# Patient Record
Sex: Male | Born: 1995 | Race: White | Hispanic: No | Marital: Single
Health system: Southern US, Community
[De-identification: ages and names within clinical notes are randomized; demographics above are authoritative.]

## PROBLEM LIST (undated history)

## (undated) HISTORY — PX: SHOULDER SURGERY: SHX246

---
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
## 2017-02-14 DIAGNOSIS — F900 Attention-deficit hyperactivity disorder, predominantly inattentive type: Secondary | ICD-10-CM | POA: Diagnosis not present

## 2017-10-03 DIAGNOSIS — Z23 Encounter for immunization: Secondary | ICD-10-CM | POA: Diagnosis not present

## 2019-07-14 DIAGNOSIS — Z20828 Contact with and (suspected) exposure to other viral communicable diseases: Secondary | ICD-10-CM | POA: Diagnosis not present

## 2019-08-15 DIAGNOSIS — Z20828 Contact with and (suspected) exposure to other viral communicable diseases: Secondary | ICD-10-CM | POA: Diagnosis not present

## 2021-07-16 DIAGNOSIS — Z23 Encounter for immunization: Secondary | ICD-10-CM | POA: Diagnosis not present

## 2021-10-28 ENCOUNTER — Other Ambulatory Visit: Payer: Self-pay

## 2021-10-28 ENCOUNTER — Encounter: Payer: Self-pay | Admitting: Emergency Medicine

## 2021-10-28 ENCOUNTER — Ambulatory Visit
Admission: EM | Admit: 2021-10-28 | Discharge: 2021-10-28 | Disposition: A | Discharge status: Home / Self Care | Payer: BC Managed Care – PPO | Attending: Physician Assistant | Admitting: Physician Assistant

## 2021-10-28 ENCOUNTER — Ambulatory Visit (INDEPENDENT_AMBULATORY_CARE_PROVIDER_SITE_OTHER): Payer: BC Managed Care – PPO

## 2021-10-28 DIAGNOSIS — S61210A Laceration without foreign body of right index finger without damage to nail, initial encounter: Secondary | ICD-10-CM

## 2021-10-28 MED ORDER — TETANUS-DIPHTH-ACELL PERTUSSIS 5-2.5-18.5 LF-MCG/0.5 IM SUSY
0.5000 mL | PREFILLED_SYRINGE | Freq: Once | INTRAMUSCULAR | Status: AC
  Administered 2021-10-28: 0.5 mL via INTRAMUSCULAR

## 2021-10-28 NOTE — ED Provider Notes (Signed)
[facility]    CSN: [id number] Arrival date & time: 10/28/21  1035      History   Chief Complaint Chief Complaint  Patient presents with   Finger Injury    HPI Joshua Stein is a 26 y.o. male.   Patient here today for evaluation of laceration to right index finger that occurred last night while him and his brother were working on a truck. He reports he accidentally had drill bit go through part of his distal index finger. He had immediate pain and bleeding. They were able to control bleeding with compressive dressing which he wore throughout the night. Wound was cleaned several times. He is due for tetanus vaccine. Father is a physician and is here with patient today.   The history is provided by the patient.   History reviewed. No pertinent past medical history.  There are no problems to display for this patient.   History reviewed. No pertinent surgical history.     Home Medications    Prior to Admission medications   Not on File    Family History History reviewed. No pertinent family history.  Social History     Allergies   Patient has no known allergies.   Review of Systems Review of Systems  Constitutional:  Negative for chills and fever.  Eyes:  Negative for discharge and redness.  Skin:  Positive for wound. Negative for color change.  Neurological:  Negative for numbness.    Physical Exam Triage Vital Signs ED Triage Vitals  Enc Vitals Group     BP      Pulse      Resp      Temp      Temp src      SpO2      Weight      Height      Head Circumference      Peak Flow      Pain Score      Pain Loc      Pain Edu?      Excl. in GC?    No data found.  Updated Vital Signs BP 136/90 (BP Location: Left Arm)    Pulse 90    Temp 98.1 F (36.7 C) (Oral)    Resp 18    SpO2 99%      Physical Exam Vitals and nursing note reviewed.  Constitutional:      General: He is not in acute distress.    Appearance: Normal appearance. He  is not ill-appearing.  HENT:     Head: Normocephalic and atraumatic.  Eyes:     Conjunctiva/sclera: Conjunctivae normal.  Cardiovascular:     Rate and Rhythm: Normal rate.  Pulmonary:     Effort: Pulmonary effort is normal.  Musculoskeletal:     Comments: Mildly decreased ROM of right index PIP due to pain  Skin:    Capillary Refill: Normal cap refill to distal right index finger    Comments: ~ 2.5 cm irregularly shaped laceration to right lateral distal index finger starting at PIP and extending to distal finger pad, minimal active bleeding  Neurological:     Mental Status: He is alert.     Comments: Sensation intact to distal right finger  Psychiatric:        Mood and Affect: Mood normal.        Behavior: Behavior normal.        Thought Content: Thought content normal.     UC Treatments /  Results  Labs (all labs ordered are listed, but only abnormal results are displayed) Labs Reviewed - No data to display  EKG   Radiology DG Finger Index Right  Result Date: 10/28/2021 CLINICAL DATA:  Patient with laceration to the index finger. EXAM: RIGHT INDEX FINGER 2+V COMPARISON:  None. FINDINGS: Soft tissue laceration to the distal aspect of the index finger. No radiopaque foreign body. No evidence for acute osseous abnormality. IMPRESSION: Soft tissue injury to the distal index finger. No definite acute osseous abnormality. Electronically Signed   By: Annia Belt M.D.   On: 10/28/2021 11:28    Procedures Laceration Repair  Date/Time: 10/28/2021 3:26 PM Performed by: Tomi Bamberger, PA-C Authorized by: Tomi Bamberger, PA-C   Consent:    Consent obtained:  Verbal   Consent given by:  Patient   Risks, benefits, and alternatives were discussed: yes     Risks discussed:  Infection and pain   Alternatives discussed:  No treatment Universal protocol:    Procedure explained and questions answered to patient or proxy's satisfaction: yes     Imaging studies available: yes      Required blood products, implants, devices, and special equipment available: yes     Patient identity confirmed:  Provided demographic data Anesthesia:    Anesthesia method:  Local infiltration   Local anesthetic:  Lidocaine 1% w/o epi (2 cc) Laceration details:    Location:  Finger   Finger location:  R index finger   Length (cm):  2.5   Depth (mm):  3 Exploration:    Contaminated: no   Treatment:    Area cleansed with:  Povidone-iodine   Amount of cleaning:  Standard   Debridement:  None Skin repair:    Repair method:  Sutures   Suture size:  4-0   Suture material:  Nylon   Suture technique:  Simple interrupted   Number of sutures:  7 Approximation:    Approximation:  Close Repair type:    Repair type:  Simple Post-procedure details:    Dressing:  Open (no dressing)   Procedure completion:  Tolerated well, no immediate complications (including critical care time)  Medications Ordered in UC Medications  Tdap (BOOSTRIX) injection 0.5 mL (0.5 mLs Intramuscular Given 10/28/21 1221)    Initial Impression / Assessment and Plan / UC Course  I have reviewed the triage vital signs and the nursing notes.  Pertinent labs & imaging results that were available during my care of the patient were reviewed by me and considered in my medical decision making (see chart for details).    Xray without fracture or foreign body. Laceration repaired in office with precaution to monitor for signs of infection. Advised suture removal in 7-10 days. Encourage sooner follow up with any concerns.   Final Clinical Impressions(s) / UC Diagnoses   Final diagnoses:  Laceration of right index finger without foreign body without damage to nail, initial encounter     Discharge Instructions       Keep wound clean and dry. Follow up with any signs of infection including increased erythema, swelling, pain, drainage. I would recommend removing sutures in 7-10 days.      ED Prescriptions   None     PDMP not reviewed this encounter.   Tomi Bamberger, PA-C 10/28/21 1529

## 2021-10-28 NOTE — Discharge Instructions (Signed)
°  Keep wound clean and dry. Follow up with any signs of infection including increased erythema, swelling, pain, drainage. I would recommend removing sutures in 7-10 days.

## 2021-10-28 NOTE — ED Triage Notes (Signed)
Pt here for right index finger laceration from drill bit last night; bleeding controlled

## 2021-11-19 DIAGNOSIS — M79671 Pain in right foot: Secondary | ICD-10-CM | POA: Diagnosis not present

## 2021-11-19 DIAGNOSIS — S9031XA Contusion of right foot, initial encounter: Secondary | ICD-10-CM | POA: Diagnosis not present

## 2022-09-11 IMAGING — DX DG FINGER INDEX 2+V*R*
2 series · 2 of 2 positions shown · non-contrast
Comparison: None.

CLINICAL DATA: Patient with laceration to the index finger.

EXAM:
RIGHT INDEX FINGER 2+V

[finger pa]
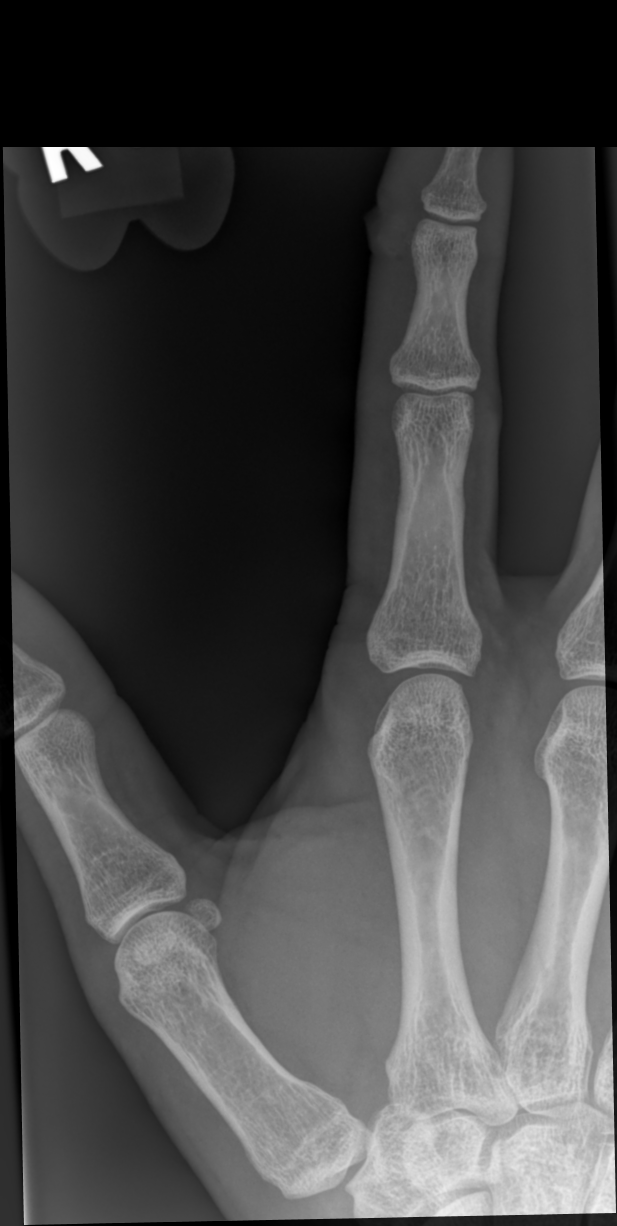

[finger lat]
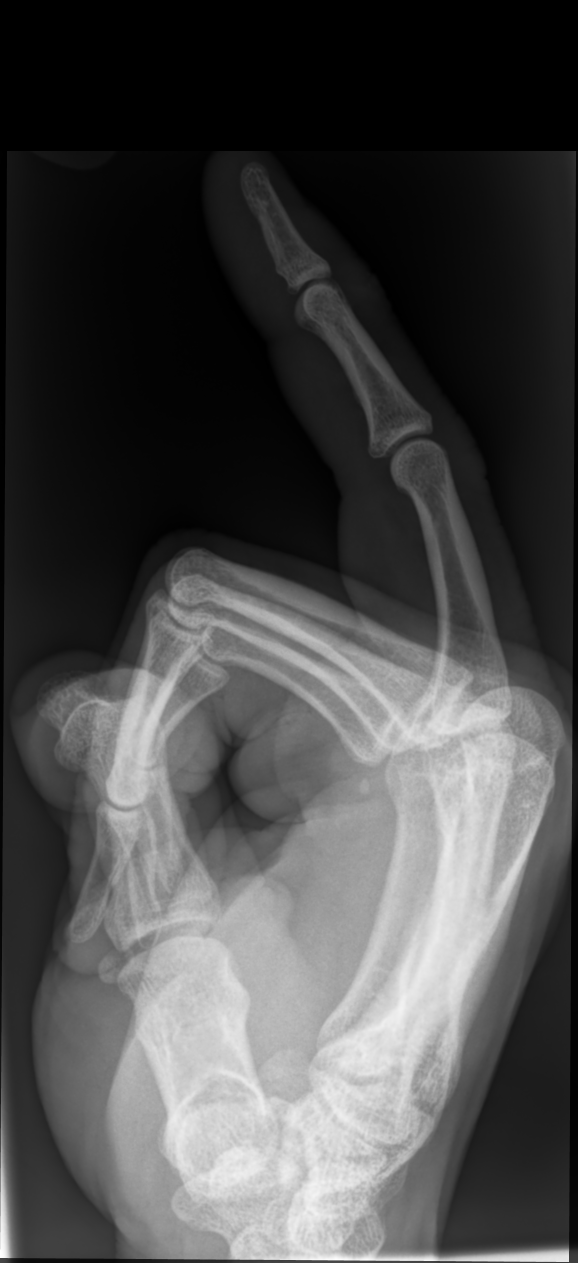

[2 of 2 positions shown; findings below may reference images not displayed]

FINDINGS: Soft tissue laceration to the distal aspect of the index finger. No
radiopaque foreign body. No evidence for acute osseous abnormality.
IMPRESSION: Soft tissue injury to the distal index finger. No definite acute
osseous abnormality.

## 2022-10-08 DIAGNOSIS — S6992XA Unspecified injury of left wrist, hand and finger(s), initial encounter: Secondary | ICD-10-CM | POA: Diagnosis not present

## 2023-01-09 DIAGNOSIS — M542 Cervicalgia: Secondary | ICD-10-CM | POA: Diagnosis not present

## 2023-02-18 ENCOUNTER — Encounter: Payer: Self-pay | Admitting: *Deleted

## 2023-02-18 ENCOUNTER — Ambulatory Visit: Payer: BC Managed Care – PPO | Admitting: Sports Medicine

## 2023-02-18 VITALS — BP 128/84 | Ht 74.0 in | Wt 195.0 lb

## 2023-02-18 DIAGNOSIS — M25511 Pain in right shoulder: Secondary | ICD-10-CM | POA: Diagnosis not present

## 2023-02-18 NOTE — Patient Instructions (Addendum)
We will send a referral to [facility] to get you set up with Dr. Ave Filter, shoulder surgeon.  Will call you with appointment time  [facility] [address] [place] [phone] Monday May 20th at 245p Arrival time is 215p

## 2023-02-18 NOTE — Progress Notes (Addendum)
   New Patient Office Visit  Subjective   Patient ID: SEDRICK TOBER, male    DOB: 02-07-1996  Age: 27 y.o. MRN: [id number]  Recurrent right shoulder dislocation.  Mahan 27 year old male here today with chief complaint of recurrent right shoulder dislocations since he was a child.  He reports he is most certainly had more than 10 dislocations unsure on the age of his first dislocation.  He has always been able to self reduce his shoulder and has not had previous workup for this.  He reports he began having more frequent dislocations about 3 months ago.  The year prior he was doing very well.  Over the past couple of months he is woken up with his shoulder dislocated, had a shoulder dislocation reaching behind his back and even jumping into the water off his paddle board a couple weeks ago.  He denies any weakness or numbness and tingling radiating down his arm.  He reports he is having a lot of anxiety and concern around this happening again which is keeping him from activities of daily living and things he enjoys.  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  He denies any other joint dislocations or subluxations in the past.   ROS as listed above in HPI    Objective:     BP 128/84   Ht 6\' 2"  (1.88 m)   Wt 195 lb (88.5 kg)   BMI 25.04 kg/m   Physical Exam Vitals reviewed.  Constitutional:      General: He is not in acute distress.    Appearance: Normal appearance. He is not ill-appearing, toxic-appearing or diaphoretic.  Pulmonary:     Effort: Pulmonary effort is normal.  Neurological:     Mental Status: He is alert.   Right shoulder: No obvious deformity or asymmetry.  No ecchymosis edema or effusion.  Symmetric shoulder carrying height.  No obvious deltoid atrophy.  Full range of motion with forward flexion, abduction, external and internal rotation.  Strength 5/5 resisted forward flexion, external rotation, internal  rotation, abduction.  Sensation intact to light touch over the axillary nerve distribution.  Grip strength 5/5.  Radial pulse 2+.  Negative empty can, negative O'Brien's.  Positive sulcus sign.  Positive apprehension.  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  Patient has good rotator cuff strength positive apprehension and sulcus.  In light of his recurrent dislocations we will get him set up to see Dr. Ave Filter at [facility] to discuss surgical intervention.  Will hold off on x-rays as he will have some at [facility].  Will call him with an appointment time at [facility].  Follow-up with [place] as needed.       No follow-ups on file.    Claudie Leach, DO  I observed and examined the patient with the [facility] resident and agree with assessment and plan.  Note reviewed and modified by me. Sterling Big, MD  I called patient and advised him that based on MRI he does probably need surgery and we would like Dr Ave Filter to see him.  He has not followed up yet so we will call ortho office and encourage them to get him seen.  KBF

## 2023-02-18 NOTE — Assessment & Plan Note (Signed)
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  Patient has good rotator cuff strength positive apprehension and sulcus.  In light of his recurrent dislocations we will get him set up to see Dr. Ave Filter at [facility] orthopedics to discuss surgical intervention.  Will hold off on x-rays as he will have some at [facility].  Will call him with an appointment time at [facility].  Follow-up with [place] as needed.

## 2023-02-19 NOTE — Addendum Note (Signed)
Addended by: Rutha Bouchard E on: 02/19/2023 01:40 PM   Modules accepted: Orders

## 2023-02-26 DIAGNOSIS — Z139 Encounter for screening, unspecified: Secondary | ICD-10-CM | POA: Diagnosis not present

## 2023-03-11 ENCOUNTER — Ambulatory Visit
Admission: RE | Admit: 2023-03-11 | Discharge: 2023-03-11 | Disposition: A | Discharge status: Home / Self Care | Payer: BC Managed Care – PPO | Source: Ambulatory Visit | Attending: Sports Medicine | Admitting: Sports Medicine

## 2023-03-11 DIAGNOSIS — M67813 Other specified disorders of tendon, right shoulder: Secondary | ICD-10-CM | POA: Diagnosis not present

## 2023-03-11 DIAGNOSIS — M25511 Pain in right shoulder: Secondary | ICD-10-CM

## 2023-03-11 MED ORDER — IOPAMIDOL (ISOVUE-M 200) INJECTION 41%
15.0000 mL | Freq: Once | INTRAMUSCULAR | Status: AC
  Administered 2023-03-11: 15 mL via INTRA_ARTICULAR

## 2023-03-18 ENCOUNTER — Encounter: Payer: Self-pay | Admitting: *Deleted

## 2023-03-24 DIAGNOSIS — M25311 Other instability, right shoulder: Secondary | ICD-10-CM | POA: Diagnosis not present

## 2023-04-15 DIAGNOSIS — S43491A Other sprain of right shoulder joint, initial encounter: Secondary | ICD-10-CM | POA: Diagnosis not present

## 2023-04-15 DIAGNOSIS — S42291A Other displaced fracture of upper end of right humerus, initial encounter for closed fracture: Secondary | ICD-10-CM | POA: Diagnosis not present

## 2023-04-15 DIAGNOSIS — G8918 Other acute postprocedural pain: Secondary | ICD-10-CM | POA: Diagnosis not present

## 2023-04-15 DIAGNOSIS — S42201A Unspecified fracture of upper end of right humerus, initial encounter for closed fracture: Secondary | ICD-10-CM | POA: Diagnosis not present

## 2023-04-24 DIAGNOSIS — M25611 Stiffness of right shoulder, not elsewhere classified: Secondary | ICD-10-CM | POA: Diagnosis not present

## 2023-04-24 DIAGNOSIS — S43431D Superior glenoid labrum lesion of right shoulder, subsequent encounter: Secondary | ICD-10-CM | POA: Diagnosis not present

## 2023-04-30 DIAGNOSIS — M25611 Stiffness of right shoulder, not elsewhere classified: Secondary | ICD-10-CM | POA: Diagnosis not present

## 2023-04-30 DIAGNOSIS — S43431D Superior glenoid labrum lesion of right shoulder, subsequent encounter: Secondary | ICD-10-CM | POA: Diagnosis not present

## 2023-05-05 DIAGNOSIS — M25611 Stiffness of right shoulder, not elsewhere classified: Secondary | ICD-10-CM | POA: Diagnosis not present

## 2023-05-05 DIAGNOSIS — S43431D Superior glenoid labrum lesion of right shoulder, subsequent encounter: Secondary | ICD-10-CM | POA: Diagnosis not present

## 2023-05-07 DIAGNOSIS — M25611 Stiffness of right shoulder, not elsewhere classified: Secondary | ICD-10-CM | POA: Diagnosis not present

## 2023-05-07 DIAGNOSIS — S43431D Superior glenoid labrum lesion of right shoulder, subsequent encounter: Secondary | ICD-10-CM | POA: Diagnosis not present

## 2023-05-13 DIAGNOSIS — S43431D Superior glenoid labrum lesion of right shoulder, subsequent encounter: Secondary | ICD-10-CM | POA: Diagnosis not present

## 2023-05-13 DIAGNOSIS — M25611 Stiffness of right shoulder, not elsewhere classified: Secondary | ICD-10-CM | POA: Diagnosis not present

## 2023-05-15 DIAGNOSIS — M25611 Stiffness of right shoulder, not elsewhere classified: Secondary | ICD-10-CM | POA: Diagnosis not present

## 2023-05-15 DIAGNOSIS — S43431D Superior glenoid labrum lesion of right shoulder, subsequent encounter: Secondary | ICD-10-CM | POA: Diagnosis not present

## 2023-05-19 DIAGNOSIS — M25611 Stiffness of right shoulder, not elsewhere classified: Secondary | ICD-10-CM | POA: Diagnosis not present

## 2023-05-19 DIAGNOSIS — S43431D Superior glenoid labrum lesion of right shoulder, subsequent encounter: Secondary | ICD-10-CM | POA: Diagnosis not present

## 2023-05-21 DIAGNOSIS — M25611 Stiffness of right shoulder, not elsewhere classified: Secondary | ICD-10-CM | POA: Diagnosis not present

## 2023-05-21 DIAGNOSIS — S43431D Superior glenoid labrum lesion of right shoulder, subsequent encounter: Secondary | ICD-10-CM | POA: Diagnosis not present

## 2023-06-03 DIAGNOSIS — M25611 Stiffness of right shoulder, not elsewhere classified: Secondary | ICD-10-CM | POA: Diagnosis not present

## 2023-06-03 DIAGNOSIS — S43431D Superior glenoid labrum lesion of right shoulder, subsequent encounter: Secondary | ICD-10-CM | POA: Diagnosis not present

## 2023-06-05 DIAGNOSIS — M25611 Stiffness of right shoulder, not elsewhere classified: Secondary | ICD-10-CM | POA: Diagnosis not present

## 2023-06-05 DIAGNOSIS — S43431D Superior glenoid labrum lesion of right shoulder, subsequent encounter: Secondary | ICD-10-CM | POA: Diagnosis not present

## 2023-06-11 DIAGNOSIS — M25611 Stiffness of right shoulder, not elsewhere classified: Secondary | ICD-10-CM | POA: Diagnosis not present

## 2023-06-11 DIAGNOSIS — S43431D Superior glenoid labrum lesion of right shoulder, subsequent encounter: Secondary | ICD-10-CM | POA: Diagnosis not present

## 2023-06-16 DIAGNOSIS — M25611 Stiffness of right shoulder, not elsewhere classified: Secondary | ICD-10-CM | POA: Diagnosis not present

## 2023-06-16 DIAGNOSIS — S43431A Superior glenoid labrum lesion of right shoulder, initial encounter: Secondary | ICD-10-CM | POA: Diagnosis not present

## 2023-06-23 DIAGNOSIS — M25611 Stiffness of right shoulder, not elsewhere classified: Secondary | ICD-10-CM | POA: Diagnosis not present

## 2023-06-23 DIAGNOSIS — S43431D Superior glenoid labrum lesion of right shoulder, subsequent encounter: Secondary | ICD-10-CM | POA: Diagnosis not present

## 2023-06-25 DIAGNOSIS — S43431D Superior glenoid labrum lesion of right shoulder, subsequent encounter: Secondary | ICD-10-CM | POA: Diagnosis not present

## 2023-06-25 DIAGNOSIS — M25611 Stiffness of right shoulder, not elsewhere classified: Secondary | ICD-10-CM | POA: Diagnosis not present

## 2023-07-02 DIAGNOSIS — M25611 Stiffness of right shoulder, not elsewhere classified: Secondary | ICD-10-CM | POA: Diagnosis not present

## 2023-07-02 DIAGNOSIS — S43431D Superior glenoid labrum lesion of right shoulder, subsequent encounter: Secondary | ICD-10-CM | POA: Diagnosis not present

## 2023-07-10 DIAGNOSIS — M25611 Stiffness of right shoulder, not elsewhere classified: Secondary | ICD-10-CM | POA: Diagnosis not present

## 2023-07-10 DIAGNOSIS — S43431D Superior glenoid labrum lesion of right shoulder, subsequent encounter: Secondary | ICD-10-CM | POA: Diagnosis not present

## 2023-07-21 DIAGNOSIS — S43431D Superior glenoid labrum lesion of right shoulder, subsequent encounter: Secondary | ICD-10-CM | POA: Diagnosis not present

## 2023-07-21 DIAGNOSIS — M25611 Stiffness of right shoulder, not elsewhere classified: Secondary | ICD-10-CM | POA: Diagnosis not present

## 2023-07-23 DIAGNOSIS — M25611 Stiffness of right shoulder, not elsewhere classified: Secondary | ICD-10-CM | POA: Diagnosis not present

## 2023-07-23 DIAGNOSIS — S43431D Superior glenoid labrum lesion of right shoulder, subsequent encounter: Secondary | ICD-10-CM | POA: Diagnosis not present

## 2023-07-31 DIAGNOSIS — S43431D Superior glenoid labrum lesion of right shoulder, subsequent encounter: Secondary | ICD-10-CM | POA: Diagnosis not present

## 2023-07-31 DIAGNOSIS — M25611 Stiffness of right shoulder, not elsewhere classified: Secondary | ICD-10-CM | POA: Diagnosis not present

## 2023-08-06 ENCOUNTER — Emergency Department (HOSPITAL_BASED_OUTPATIENT_CLINIC_OR_DEPARTMENT_OTHER)
Admission: EM | Admit: 2023-08-06 | Discharge: 2023-08-06 | Disposition: A | Discharge status: Home / Self Care | Payer: BC Managed Care – PPO | Attending: Emergency Medicine | Admitting: Emergency Medicine

## 2023-08-06 ENCOUNTER — Encounter (HOSPITAL_BASED_OUTPATIENT_CLINIC_OR_DEPARTMENT_OTHER): Payer: Self-pay

## 2023-08-06 ENCOUNTER — Other Ambulatory Visit: Payer: Self-pay

## 2023-08-06 ENCOUNTER — Emergency Department (HOSPITAL_BASED_OUTPATIENT_CLINIC_OR_DEPARTMENT_OTHER): Payer: BC Managed Care – PPO

## 2023-08-06 DIAGNOSIS — M79674 Pain in right toe(s): Secondary | ICD-10-CM | POA: Diagnosis not present

## 2023-08-06 DIAGNOSIS — W228XXA Striking against or struck by other objects, initial encounter: Secondary | ICD-10-CM | POA: Diagnosis not present

## 2023-08-06 DIAGNOSIS — Y9301 Activity, walking, marching and hiking: Secondary | ICD-10-CM | POA: Insufficient documentation

## 2023-08-06 DIAGNOSIS — M79671 Pain in right foot: Secondary | ICD-10-CM | POA: Diagnosis not present

## 2023-08-06 NOTE — ED Triage Notes (Signed)
Pt states that he was grilling chicken last night and stepped on a pointy rock. Is continuing to have foot pain this am in right foot, visible limp on assessment.   Robina Ade, RN

## 2023-08-06 NOTE — Discharge Instructions (Signed)
Please read and follow all provided instructions.  Your diagnoses today include:  1. Right foot pain     Tests performed today include: An x-ray of the affected area - does NOT show any broken bones Vital signs. See below for your results today.   Medications prescribed:  Please use over-the-counter NSAID medications (ibuprofen, naproxen) or Tylenol (acetaminophen) as directed on the packaging for pain -- as long as you do not have any reasons avoid these medications. Reasons to avoid NSAID medications include: weak kidneys, a history of bleeding in your stomach or gut, or uncontrolled high blood pressure or previous heart attack. Reasons to avoid Tylenol include: liver problems or ongoing alcohol use. Never take more than 4000mg  or 8 Extra strength Tylenol in a 24 hour period.     Take any prescribed medications only as directed.  Home care instructions:  Follow any educational materials contained in this packet Follow R.I.C.E. Protocol: R - rest your injury  I  - use ice on injury without applying directly to skin C - compress injury with bandage or splint E - elevate the injury as much as possible  Follow-up instructions: Please follow-up with your primary care provider if you continue to have significant pain in 1 week. In this case you may have a more severe injury that requires further care.   Return instructions:  Please return if your toes or feet are numb or tingling, appear gray or blue, or you have severe pain (also elevate the leg and loosen splint or wrap if you were given one) Please return to the Emergency Department if you experience worsening symptoms.  Please return if you have any other emergent concerns.  Additional Information:  Your vital signs today were: BP (!) 141/95   Pulse 87   Temp 98.2 F (36.8 C)   Resp 16   Ht 6\' 2"  (1.88 m)   Wt 88.9 kg   SpO2 99%   BMI 25.16 kg/m  If your blood pressure (BP) was elevated above 135/85 this visit, please have  this repeated by your doctor within one month. --------------

## 2023-08-06 NOTE — ED Provider Notes (Signed)
[facility] [facility] Provider Note   CSN: [id number] Arrival date & time: 08/06/23  2440     History  Chief Complaint  Patient presents with   Foot Pain    Joshua Stein is a 27 y.o. male.  Patient presents emergency department today for evaluation of right foot pain.  Patient states that he was walking last night and struck his fourth toe area on a rock.  No significant bleeding or wounds.  No significant pain initially.  He slept overnight and upon waking this morning had pain over the lateral aspect of his foot, worse with bearing weight.  Minimal swelling.  No fevers.  No treatments prior to arrival.       Home Medications Prior to Admission medications   Not on File      Allergies    Acetazolamide    Review of Systems   Review of Systems  Physical Exam Updated Vital Signs BP (!) 141/95   Pulse 87   Temp 98.2 F (36.8 C)   Resp 16   Ht 6\' 2"  (1.88 m)   Wt 88.9 kg   SpO2 99%   BMI 25.16 kg/m   Physical Exam Vitals and nursing note reviewed.  Constitutional:      Appearance: He is well-developed.  HENT:     Head: Normocephalic and atraumatic.  Eyes:     Conjunctiva/sclera: Conjunctivae normal.  Pulmonary:     Effort: No respiratory distress.  Musculoskeletal:     Cervical back: Normal range of motion and neck supple.     Right foot: Normal range of motion. Tenderness present. Normal pulse.     Comments: There is a small abrasion over the dorsum of the fourth toe on the right.  Tenderness to the lateral foot, base of fifth toe area without signs of trauma.  No deformities.  2+ DP pulse.  Skin:    General: Skin is warm and dry.  Neurological:     Mental Status: He is alert.     ED Results / Procedures / Treatments   Labs (all labs ordered are listed, but only abnormal results are displayed) Labs Reviewed - No data to display  EKG None  Radiology DG Foot Complete Right  Result Date: 08/06/2023 CLINICAL  DATA:  lateral toe/foot pain. EXAM: RIGHT FOOT COMPLETE - 3+ VIEW COMPARISON:  None Available. FINDINGS: No acute fracture or dislocation. No aggressive osseous lesion. No significant arthritis. No focal soft tissue swelling. No radiopaque foreign bodies. IMPRESSION: *No acute osseous abnormality of the right foot. Electronically Signed   By: Jules Schick M.D.   On: 08/06/2023 10:12    Procedures Procedures    Medications Ordered in ED Medications - No data to display  ED Course/ Medical Decision Making/ A&P    Patient seen and examined. History obtained directly from patient.   Labs/EKG: None ordered  Imaging: Ordered x-ray of the foot.  Medications/Fluids: None ordered  Most recent vital signs reviewed and are as follows: BP (!) 141/95   Pulse 87   Temp 98.2 F (36.8 C)   Resp 16   Ht 6\' 2"  (1.88 m)   Wt 88.9 kg   SpO2 99%   BMI 25.16 kg/m   Initial impression: Foot sprain, overall low concern for foreign body or significant wound.  10:23 AM Reassessment performed. Patient appears stable.   Imaging personally visualized and interpreted including: X-ray of foot, agree negative.  Reviewed pertinent lab work and imaging  with patient at bedside. Questions answered.   Most current vital signs reviewed and are as follows: BP (!) 141/95   Pulse 87   Temp 98.2 F (36.8 C)   Resp 16   Ht 6\' 2"  (1.88 m)   Wt 88.9 kg   SpO2 99%   BMI 25.16 kg/m   Plan: Discharge to home.   Prescriptions written for: None  Other home care instructions discussed: RICE, NSAIDs, wound care  ED return instructions discussed: Pt urged to return with worsening pain, worsening swelling, expanding area of redness or streaking up extremity, fever, or any other concerns.   Follow-up instructions discussed: Patient encouraged to follow-up with their PCP as needed.                               Medical Decision Making Amount and/or Complexity of Data Reviewed Radiology:  ordered.   Patient with foot injury yesterday.  Small abrasion over the fourth toe.  No signs of infection.  X-ray negative for foreign body or fracture.  Routine care indicated at this time.        Final Clinical Impression(s) / ED Diagnoses Final diagnoses:  Right foot pain    Rx / DC Orders ED Discharge Orders     None         Renne Crigler, PA-C 08/06/23 1025    Lonell Grandchild, MD 08/06/23 1153

## 2024-05-10 DIAGNOSIS — Z77011 Contact with and (suspected) exposure to lead: Secondary | ICD-10-CM | POA: Diagnosis not present
